# Patient Record
Sex: Female | Born: 1980 | Race: White | Hispanic: No | Marital: Married | Smoking: Never smoker
Health system: Southern US, Community
[De-identification: ages and names within clinical notes are randomized; demographics above are authoritative.]

## PROBLEM LIST (undated history)

## (undated) ENCOUNTER — Inpatient Hospital Stay (HOSPITAL_COMMUNITY): Payer: Self-pay

## (undated) DIAGNOSIS — IMO0002 Reserved for concepts with insufficient information to code with codable children: Secondary | ICD-10-CM

## (undated) DIAGNOSIS — O09299 Supervision of pregnancy with other poor reproductive or obstetric history, unspecified trimester: Secondary | ICD-10-CM

## (undated) DIAGNOSIS — Z8632 Personal history of gestational diabetes: Secondary | ICD-10-CM

## (undated) HISTORY — DX: Reserved for concepts with insufficient information to code with codable children: IMO0002

## (undated) HISTORY — PX: NO PAST SURGERIES: SHX2092

## (undated) HISTORY — DX: Supervision of pregnancy with other poor reproductive or obstetric history, unspecified trimester: O09.299

## (undated) HISTORY — DX: Personal history of gestational diabetes: Z86.32

---

## 2007-07-13 ENCOUNTER — Encounter: Admission: RE | Admit: 2007-07-13 | Discharge: 2007-07-14 | Payer: Self-pay | Admitting: Obstetrics and Gynecology

## 2007-09-11 ENCOUNTER — Inpatient Hospital Stay (HOSPITAL_COMMUNITY): Admission: AD | Admit: 2007-09-11 | Discharge: 2007-09-14 | Payer: Self-pay | Admitting: Obstetrics and Gynecology

## 2010-12-10 NOTE — H&P (Signed)
NAMETALANI, BRAZEE NO.:  [id number]   MEDICAL RECORD NO.:  [id number]          PATIENT TYPE:  INP   LOCATION:                                FACILITY:  [facility]   PHYSICIAN:  Lenoard Aden, M.D.     DATE OF BIRTH:   DATE OF ADMISSION:  09/11/2007  DATE OF DISCHARGE:  09/14/2007                              HISTORY & PHYSICAL   CHIEF COMPLAINT:  Gestational diabetes at 40 weeks. __________.   __________   It was complicated by GBS ______pos and___ gestational diabetes which  was diet controlled.   FAMILY HISTORY:  Noncontributory.   PHYSICAL EXAMINATION:  Well-developed, well-nourished _wf________  female, no acute distress.  HEENT:  ____wnl______.  Lgs: CTA  CV: RRR  ABDOMEN:  Soft, gravid, nontender.  _____ve_____ .  Cervix is closed, 50%, vertex, -1 station.  _______ext : neg c/c/e___.   ASSESSMENT:  1. Term intrauterine pregnancy at 40 weeks.  2. Gestational diabetes, diet controlled.  3. Group B strep  positive.   PLAN:  Cervidil placed, NST reactive.  Will proceed with induction.  Epidural as needed.      Lenoard Aden, M.D.  Electronically Signed     RJT/MEDQ  D:  09/11/2007  T:  09/12/2007  Job:  [id number]

## 2011-02-24 LAB — ANTIBODY SCREEN: Antibody Screen: NEGATIVE

## 2011-02-24 LAB — RUBELLA ANTIBODY, IGM: Rubella: IMMUNE

## 2011-02-24 LAB — HEPATITIS B SURFACE ANTIGEN: Hepatitis B Surface Ag: NEGATIVE

## 2011-03-10 ENCOUNTER — Other Ambulatory Visit: Payer: Self-pay | Admitting: Obstetrics and Gynecology

## 2011-03-10 DIAGNOSIS — R772 Abnormality of alphafetoprotein: Secondary | ICD-10-CM

## 2011-03-18 ENCOUNTER — Ambulatory Visit (HOSPITAL_COMMUNITY)
Admission: RE | Admit: 2011-03-18 | Discharge: 2011-03-18 | Disposition: A | Discharge status: Home / Self Care | Payer: 59 | Source: Ambulatory Visit | Attending: Obstetrics and Gynecology | Admitting: Obstetrics and Gynecology

## 2011-03-18 ENCOUNTER — Other Ambulatory Visit: Payer: Self-pay | Admitting: Obstetrics and Gynecology

## 2011-03-18 DIAGNOSIS — Z0489 Encounter for examination and observation for other specified reasons: Secondary | ICD-10-CM

## 2011-03-18 DIAGNOSIS — Z3689 Encounter for other specified antenatal screening: Secondary | ICD-10-CM | POA: Insufficient documentation

## 2011-03-18 DIAGNOSIS — O3510X0 Maternal care for (suspected) chromosomal abnormality in fetus, unspecified, not applicable or unspecified: Secondary | ICD-10-CM | POA: Insufficient documentation

## 2011-03-18 DIAGNOSIS — O351XX0 Maternal care for (suspected) chromosomal abnormality in fetus, not applicable or unspecified: Secondary | ICD-10-CM | POA: Insufficient documentation

## 2011-03-18 DIAGNOSIS — IMO0002 Reserved for concepts with insufficient information to code with codable children: Secondary | ICD-10-CM

## 2011-03-18 DIAGNOSIS — R772 Abnormality of alphafetoprotein: Secondary | ICD-10-CM

## 2011-03-18 NOTE — Progress Notes (Signed)
Genetic Counseling  High-Risk Gestation Note  Appointment Date:  03/18/2011 Referred By: Geryl Rankins, MD Date of Birth:  08-02-1980 Partner: Roma Kayser   I met with Mrs. Arnika Larzelere and her husband, Mr. Rosaland Shiffman for prenatal genetic counseling given a screen positive Down syndrome result from Quad screening through [facility].  We reviewed the results of her Quad screen which increased the risk for Down syndrome from her age-related risk to 1 in 73. They understand that screening tests are used to modify a patient's a priori risk for aneuploidy, typically based on age.  This estimate provides a pregnancy specific risk assessment and is not diagnostic.  Additionally, this screen was screen negative for Trisomy 18 and open neural tube defects. We reviewed chromosomes and nondisjunction.  We specifically discussed Down syndrome (trisomy 21), including the common features and prognosis.    We reviewed available options of targeted ultrasound and amniocentesis.  We discussed the risks, limitations, and benefits of each.  It was discussed that not all birth defects can be identified with these procedures.  A risk of 1 in 200-300 was given for amniocentesis, the primary complication being spontaneous pregnancy loss. They were counseled that 50-80% of fetuses with Down syndrome, when well visualized, have detectable anomalies or soft markers by ultrasound.  After reviewing these options, Mrs. Danner elected to proceed with targeted ultrasound only and declined amniocentesis at this time. The couple stated that wanted more time to further consider amniocentesis and will contact our office to schedule, should they desire to pursue amniocentesis in the pregnancy.  They understand that ultrasound and Quad screen cannot rule out all birth defects or genetic syndromes.    Both family histories were reviewed and found to be noncontributory for birth defects, mental retardation, recurrent pregnancy  loss, and known genetic conditions.  Without further information regarding the provided family history, an accurate genetic risk cannot be calculated. Further genetic counseling is warranted if more information is obtained.  The patient was provided written information which discussed cystic fibrosis (CF) including: the features of CF, the incidence of 1 in 3300 in the Caucasian population, autosomal recessive inheritance, and the 25% chance of having a baby with CF if both parents are carriers of CF.  We also discussed the option of carrier testing including the pros and cons of carrier testing, as well as the option of prenatal testing if needed. We also discussed that CF is included on the newborn screening panel in [place]. Mrs. Fratto reported that she thought CF carrier screening was previously performed through her OB office and was negative. We do not currently have medical records to confirm this report.   The patient denied exposure to environmental toxins or chemical agents.  She denied the use of alcohol, tobacco or street drugs.  She denied significant viral illnesses during the course of her pregnancy.  Her medical and surgical history were noncontributory.    A complete obstetrical ultrasound was performed at the time of today's evaluation.  The ultrasound report is reported separately.     I counseled the patient for approximately 25 minutes regarding the above risks and available options.     Clydie Braun Dorman Calderwood, MS, [facility] 03/18/2011

## 2011-03-18 NOTE — Progress Notes (Signed)
Patient seen for ultrasound appointment today.  Please see [facility] report for details.  

## 2011-03-20 ENCOUNTER — Other Ambulatory Visit: Payer: Self-pay | Admitting: Obstetrics and Gynecology

## 2011-03-20 DIAGNOSIS — Z0489 Encounter for examination and observation for other specified reasons: Secondary | ICD-10-CM

## 2011-03-20 DIAGNOSIS — IMO0002 Reserved for concepts with insufficient information to code with codable children: Secondary | ICD-10-CM

## 2011-03-24 ENCOUNTER — Ambulatory Visit (HOSPITAL_COMMUNITY)
Admission: RE | Admit: 2011-03-24 | Discharge: 2011-03-24 | Disposition: A | Discharge status: Home / Self Care | Payer: 59 | Source: Ambulatory Visit | Attending: Obstetrics and Gynecology | Admitting: Obstetrics and Gynecology

## 2011-03-24 ENCOUNTER — Encounter (HOSPITAL_COMMUNITY): Payer: Self-pay

## 2011-03-24 DIAGNOSIS — IMO0002 Reserved for concepts with insufficient information to code with codable children: Secondary | ICD-10-CM

## 2011-03-24 DIAGNOSIS — O351XX0 Maternal care for (suspected) chromosomal abnormality in fetus, not applicable or unspecified: Secondary | ICD-10-CM | POA: Insufficient documentation

## 2011-03-24 DIAGNOSIS — O3510X0 Maternal care for (suspected) chromosomal abnormality in fetus, unspecified, not applicable or unspecified: Secondary | ICD-10-CM | POA: Insufficient documentation

## 2011-03-24 DIAGNOSIS — Z0489 Encounter for examination and observation for other specified reasons: Secondary | ICD-10-CM

## 2011-03-27 ENCOUNTER — Telehealth (HOSPITAL_COMMUNITY): Payer: Self-pay | Admitting: MS"

## 2011-03-27 NOTE — Telephone Encounter (Signed)
FISH for aneuploidy (13, 18, 21, and sex chromosomes) results to patient from amniocentesis. FISH results normal consistent with normal female. Final karyotype pending. Will contact patient when complete chromosome analysis resulted.

## 2011-04-02 ENCOUNTER — Telehealth (HOSPITAL_COMMUNITY): Payer: Self-pay | Admitting: MS"

## 2011-04-02 NOTE — Telephone Encounter (Signed)
Final karyotype apparently normal female (46,XX) from amniocentesis. Result to patient. Gender disclosed.

## 2011-04-15 ENCOUNTER — Ambulatory Visit (HOSPITAL_COMMUNITY)
Admission: RE | Admit: 2011-04-15 | Discharge: 2011-04-15 | Disposition: A | Discharge status: Home / Self Care | Payer: 59 | Source: Ambulatory Visit | Attending: Obstetrics and Gynecology | Admitting: Obstetrics and Gynecology

## 2011-04-15 DIAGNOSIS — Z3689 Encounter for other specified antenatal screening: Secondary | ICD-10-CM | POA: Insufficient documentation

## 2011-04-15 DIAGNOSIS — O351XX0 Maternal care for (suspected) chromosomal abnormality in fetus, not applicable or unspecified: Secondary | ICD-10-CM | POA: Insufficient documentation

## 2011-04-15 DIAGNOSIS — O99810 Abnormal glucose complicating pregnancy: Secondary | ICD-10-CM | POA: Insufficient documentation

## 2011-04-15 DIAGNOSIS — IMO0002 Reserved for concepts with insufficient information to code with codable children: Secondary | ICD-10-CM

## 2011-04-15 DIAGNOSIS — O3510X0 Maternal care for (suspected) chromosomal abnormality in fetus, unspecified, not applicable or unspecified: Secondary | ICD-10-CM | POA: Insufficient documentation

## 2011-04-15 DIAGNOSIS — Z0489 Encounter for examination and observation for other specified reasons: Secondary | ICD-10-CM

## 2011-04-15 NOTE — Progress Notes (Signed)
Patient seen for ultrasound appointment today.  Please see [facility] report for details.  

## 2011-04-18 LAB — CBC
HCT: 25.5 — ABNORMAL LOW
HCT: 36.2
Hemoglobin: 12.3
MCHC: 34.1
MCHC: 35.3
MCV: 88.6
MCV: 91.6
Platelets: 178
RBC: 3.95
RDW: 12.8

## 2011-06-09 LAB — HIV ANTIBODY (ROUTINE TESTING W REFLEX): HIV: NONREACTIVE

## 2011-07-25 LAB — STREP B DNA PROBE: GBS: POSITIVE

## 2011-07-29 NOTE — L&D Delivery Note (Signed)
Delivery Note At 2:48 PM a viable female was delivered via Vaginal, Spontaneous Delivery (Presentation: Left Occiput Anterior).  APGAR: 7, 9; weight 7 lb 15.5 oz (3615 g).   Placenta status: Intact, Spontaneous.  Cord: 3 vessels with the following complications: None.  Cord pH: pending Tight nuchal cord, surgically reduced at perineum. Baby with normal features.  Anesthesia: Epidural  Episiotomy: None Lacerations: 1st degree;Labial Suture Repair: 3.0 chromic Est. Blood Loss (mL): 300  Mom to postpartum.  Baby to skin to skin .  Geryl Rankins 08/20/2011, 4:28 PM

## 2011-08-11 ENCOUNTER — Telehealth (HOSPITAL_COMMUNITY): Payer: Self-pay | Admitting: *Deleted

## 2011-08-11 ENCOUNTER — Encounter (HOSPITAL_COMMUNITY): Payer: Self-pay | Admitting: *Deleted

## 2011-08-11 NOTE — Telephone Encounter (Signed)
Preadmission screen  

## 2011-08-16 ENCOUNTER — Inpatient Hospital Stay (HOSPITAL_COMMUNITY)
Admission: AD | Admit: 2011-08-16 | Discharge: 2011-08-16 | Disposition: A | Discharge status: Home / Self Care | Payer: 59 | Source: Ambulatory Visit | Attending: Obstetrics and Gynecology | Admitting: Obstetrics and Gynecology

## 2011-08-16 ENCOUNTER — Encounter (HOSPITAL_COMMUNITY): Payer: Self-pay | Admitting: *Deleted

## 2011-08-16 DIAGNOSIS — O99891 Other specified diseases and conditions complicating pregnancy: Secondary | ICD-10-CM | POA: Insufficient documentation

## 2011-08-16 NOTE — Discharge Planning (Signed)
Dr Richardson Dopp notified of negative fern, sve, fhr, uc pattern. Patient to be discharged home and to follow up as scheduled with Dr Dion Body.

## 2011-08-16 NOTE — Progress Notes (Signed)
Pt states, " At 9:30 pm tonight I was standing up and felt water running out. I was maybe a cup. It has not happened again. Before that happened a had a pain in my upper abdomen."

## 2011-08-17 NOTE — Discharge Summary (Signed)
Pt to follow up as scheduled

## 2011-08-19 ENCOUNTER — Other Ambulatory Visit: Payer: Self-pay | Admitting: Obstetrics and Gynecology

## 2011-08-20 ENCOUNTER — Inpatient Hospital Stay (HOSPITAL_COMMUNITY)
Admission: RE | Admit: 2011-08-20 | Discharge: 2011-08-21 | DRG: 775 | Disposition: A | Discharge status: Home / Self Care | Payer: 59 | Source: Ambulatory Visit | Attending: Obstetrics and Gynecology | Admitting: Obstetrics and Gynecology

## 2011-08-20 ENCOUNTER — Encounter (HOSPITAL_COMMUNITY): Payer: Self-pay | Admitting: Anesthesiology

## 2011-08-20 ENCOUNTER — Encounter (HOSPITAL_COMMUNITY): Payer: Self-pay

## 2011-08-20 ENCOUNTER — Inpatient Hospital Stay (HOSPITAL_COMMUNITY): Payer: 59 | Admitting: Anesthesiology

## 2011-08-20 DIAGNOSIS — Z22330 Carrier of Group B streptococcus: Secondary | ICD-10-CM

## 2011-08-20 DIAGNOSIS — O99814 Abnormal glucose complicating childbirth: Principal | ICD-10-CM | POA: Diagnosis present

## 2011-08-20 DIAGNOSIS — O99892 Other specified diseases and conditions complicating childbirth: Secondary | ICD-10-CM | POA: Diagnosis present

## 2011-08-20 LAB — CBC
HCT: 36.1 % (ref 36.0–46.0)
Hemoglobin: 12.2 g/dL (ref 12.0–15.0)
MCH: 30.7 pg (ref 26.0–34.0)
MCV: 90.9 fL (ref 78.0–100.0)
Platelets: 189 10*3/uL (ref 150–400)
RBC: 3.97 MIL/uL (ref 3.87–5.11)
WBC: 6.5 10*3/uL (ref 4.0–10.5)

## 2011-08-20 LAB — GLUCOSE, CAPILLARY: Glucose-Capillary: 74 mg/dL (ref 70–99)

## 2011-08-20 MED ORDER — TERBUTALINE SULFATE 1 MG/ML IJ SOLN: 0.2500 mg | Freq: Once | INTRAMUSCULAR | Status: DC | PRN

## 2011-08-20 MED ORDER — OXYTOCIN 10 UNIT/ML IJ SOLN
INTRAMUSCULAR | Status: AC
  Filled 2011-08-20: qty 2

## 2011-08-20 MED ORDER — SENNOSIDES-DOCUSATE SODIUM 8.6-50 MG PO TABS
2.0000 | ORAL_TABLET | Freq: Every day | ORAL | Status: DC
  Administered 2011-08-20: 2 via ORAL

## 2011-08-20 MED ORDER — DIBUCAINE 1 % RE OINT
1.0000 "application " | TOPICAL_OINTMENT | RECTAL | Status: DC | PRN
  Filled 2011-08-20: qty 28

## 2011-08-20 MED ORDER — TETANUS-DIPHTH-ACELL PERTUSSIS 5-2.5-18.5 LF-MCG/0.5 IM SUSP: 0.5000 mL | Freq: Once | INTRAMUSCULAR | Status: DC

## 2011-08-20 MED ORDER — ZOLPIDEM TARTRATE 5 MG PO TABS: 5.0000 mg | ORAL_TABLET | Freq: Every evening | ORAL | Status: DC | PRN

## 2011-08-20 MED ORDER — MEASLES, MUMPS & RUBELLA VAC ~~LOC~~ INJ: 0.5000 mL | INJECTION | Freq: Once | SUBCUTANEOUS | Status: DC

## 2011-08-20 MED ORDER — OXYCODONE-ACETAMINOPHEN 5-325 MG PO TABS
1.0000 | ORAL_TABLET | ORAL | Status: DC | PRN
  Administered 2011-08-20: 2 via ORAL
  Administered 2011-08-21: 1 via ORAL
  Filled 2011-08-20: qty 1
  Filled 2011-08-20: qty 2

## 2011-08-20 MED ORDER — PHENYLEPHRINE 40 MCG/ML (10ML) SYRINGE FOR IV PUSH (FOR BLOOD PRESSURE SUPPORT)
80.0000 ug | PREFILLED_SYRINGE | INTRAVENOUS | Status: DC | PRN
  Filled 2011-08-20: qty 5

## 2011-08-20 MED ORDER — WITCH HAZEL-GLYCERIN EX PADS
1.0000 | MEDICATED_PAD | CUTANEOUS | Status: DC | PRN
Start: 2011-08-20 — End: 2011-08-21

## 2011-08-20 MED ORDER — CITRIC ACID-SODIUM CITRATE 334-500 MG/5ML PO SOLN: 30.0000 mL | ORAL | Status: DC | PRN

## 2011-08-20 MED ORDER — ONDANSETRON HCL 4 MG/2ML IJ SOLN: 4.0000 mg | Freq: Four times a day (QID) | INTRAMUSCULAR | Status: DC | PRN

## 2011-08-20 MED ORDER — FENTANYL 2.5 MCG/ML BUPIVACAINE 1/10 % EPIDURAL INFUSION (WH - ANES)
INTRAMUSCULAR | Status: DC | PRN
  Administered 2011-08-20: 14 mL/h via EPIDURAL

## 2011-08-20 MED ORDER — EPHEDRINE 5 MG/ML INJ: 10.0000 mg | INTRAVENOUS | Status: DC | PRN

## 2011-08-20 MED ORDER — SIMETHICONE 80 MG PO CHEW: 80.0000 mg | CHEWABLE_TABLET | ORAL | Status: DC | PRN

## 2011-08-20 MED ORDER — PHENYLEPHRINE 40 MCG/ML (10ML) SYRINGE FOR IV PUSH (FOR BLOOD PRESSURE SUPPORT): 80.0000 ug | PREFILLED_SYRINGE | INTRAVENOUS | Status: DC | PRN

## 2011-08-20 MED ORDER — IBUPROFEN 600 MG PO TABS
600.0000 mg | ORAL_TABLET | Freq: Four times a day (QID) | ORAL | Status: DC
  Administered 2011-08-20 – 2011-08-21 (×5): 600 mg via ORAL
  Filled 2011-08-20 (×5): qty 1

## 2011-08-20 MED ORDER — ONDANSETRON HCL 4 MG PO TABS: 4.0000 mg | ORAL_TABLET | ORAL | Status: DC | PRN

## 2011-08-20 MED ORDER — IBUPROFEN 600 MG PO TABS: 600.0000 mg | ORAL_TABLET | Freq: Four times a day (QID) | ORAL | Status: DC | PRN

## 2011-08-20 MED ORDER — LANOLIN HYDROUS EX OINT: TOPICAL_OINTMENT | CUTANEOUS | Status: DC | PRN

## 2011-08-20 MED ORDER — FENTANYL 2.5 MCG/ML BUPIVACAINE 1/10 % EPIDURAL INFUSION (WH - ANES)
14.0000 mL/h | INTRAMUSCULAR | Status: DC
  Administered 2011-08-20: 14 mL/h via EPIDURAL
  Filled 2011-08-20 (×2): qty 60

## 2011-08-20 MED ORDER — OXYTOCIN 20 UNITS IN LACTATED RINGERS INFUSION - SIMPLE: 125.0000 mL/h | Freq: Once | INTRAVENOUS | Status: DC

## 2011-08-20 MED ORDER — EPHEDRINE 5 MG/ML INJ
10.0000 mg | INTRAVENOUS | Status: DC | PRN
  Filled 2011-08-20: qty 4

## 2011-08-20 MED ORDER — DIPHENHYDRAMINE HCL 50 MG/ML IJ SOLN: 12.5000 mg | INTRAMUSCULAR | Status: DC | PRN

## 2011-08-20 MED ORDER — LACTATED RINGERS IV SOLN
INTRAVENOUS | Status: DC
  Administered 2011-08-20: 14:00:00 via INTRAVENOUS
  Administered 2011-08-20: 125 mL/h via INTRAVENOUS
  Administered 2011-08-20: 14:00:00 via INTRAVENOUS

## 2011-08-20 MED ORDER — OXYTOCIN BOLUS FROM INFUSION
500.0000 mL | Freq: Once | INTRAVENOUS | Status: DC
  Filled 2011-08-20: qty 500

## 2011-08-20 MED ORDER — LIDOCAINE HCL (PF) 1 % IJ SOLN
30.0000 mL | INTRAMUSCULAR | Status: DC | PRN
  Filled 2011-08-20: qty 30

## 2011-08-20 MED ORDER — DEXTROSE 5 % IV SOLN
5.0000 10*6.[IU] | Freq: Once | INTRAVENOUS | Status: AC
  Administered 2011-08-20: 5 10*6.[IU] via INTRAVENOUS
  Filled 2011-08-20: qty 5

## 2011-08-20 MED ORDER — LACTATED RINGERS IV SOLN: 500.0000 mL | Freq: Once | INTRAVENOUS | Status: DC

## 2011-08-20 MED ORDER — PENICILLIN G POTASSIUM 5000000 UNITS IJ SOLR
2.5000 10*6.[IU] | INTRAVENOUS | Status: DC
  Administered 2011-08-20: 2.5 10*6.[IU] via INTRAVENOUS
  Filled 2011-08-20 (×5): qty 2.5

## 2011-08-20 MED ORDER — MAGNESIUM HYDROXIDE 400 MG/5ML PO SUSP: 30.0000 mL | ORAL | Status: DC | PRN

## 2011-08-20 MED ORDER — METHYLERGONOVINE MALEATE 0.2 MG PO TABS: 0.2000 mg | ORAL_TABLET | ORAL | Status: DC | PRN

## 2011-08-20 MED ORDER — PRENATAL MULTIVITAMIN CH
1.0000 | ORAL_TABLET | Freq: Every day | ORAL | Status: DC
  Administered 2011-08-21: 1 via ORAL
  Filled 2011-08-20: qty 1

## 2011-08-20 MED ORDER — METHYLERGONOVINE MALEATE 0.2 MG/ML IJ SOLN: 0.2000 mg | INTRAMUSCULAR | Status: DC | PRN

## 2011-08-20 MED ORDER — BENZOCAINE-MENTHOL 20-0.5 % EX AERO
INHALATION_SPRAY | CUTANEOUS | Status: AC
  Filled 2011-08-20: qty 56

## 2011-08-20 MED ORDER — ONDANSETRON HCL 4 MG/2ML IJ SOLN: 4.0000 mg | INTRAMUSCULAR | Status: DC | PRN

## 2011-08-20 MED ORDER — ACETAMINOPHEN 325 MG PO TABS: 650.0000 mg | ORAL_TABLET | ORAL | Status: DC | PRN

## 2011-08-20 MED ORDER — BENZOCAINE-MENTHOL 20-0.5 % EX AERO: 1.0000 "application " | INHALATION_SPRAY | CUTANEOUS | Status: DC | PRN

## 2011-08-20 MED ORDER — DIPHENHYDRAMINE HCL 25 MG PO CAPS: 25.0000 mg | ORAL_CAPSULE | Freq: Four times a day (QID) | ORAL | Status: DC | PRN

## 2011-08-20 MED ORDER — LACTATED RINGERS IV SOLN: 500.0000 mL | INTRAVENOUS | Status: DC | PRN

## 2011-08-20 MED ORDER — OXYTOCIN 20 UNITS IN LACTATED RINGERS INFUSION - SIMPLE
1.0000 m[IU]/min | INTRAVENOUS | Status: DC
  Administered 2011-08-20: 333 m[IU]/min via INTRAVENOUS
  Administered 2011-08-20: 2 m[IU]/min via INTRAVENOUS
  Filled 2011-08-20: qty 1000

## 2011-08-20 MED ORDER — SODIUM BICARBONATE 8.4 % IV SOLN
INTRAVENOUS | Status: DC | PRN
  Administered 2011-08-20: 4 mL via EPIDURAL

## 2011-08-20 MED ORDER — OXYCODONE-ACETAMINOPHEN 5-325 MG PO TABS: 2.0000 | ORAL_TABLET | ORAL | Status: DC | PRN

## 2011-08-20 MED ORDER — OXYTOCIN 20 UNITS IN LACTATED RINGERS INFUSION - SIMPLE: 125.0000 mL/h | INTRAVENOUS | Status: DC | PRN

## 2011-08-20 MED ORDER — FLEET ENEMA 7-19 GM/118ML RE ENEM: 1.0000 | ENEMA | RECTAL | Status: DC | PRN

## 2011-08-20 NOTE — H&P (Signed)
Morgan Wheeler is a 31 y.o. female G3 P1011 at 70 4/7 weeks presenting for induction of labor due to Gestational Diabetes.  Pt thought her water broke over the weekend but she was ruled out.  Pt ate tortilla prior to coming to hospital.  Maternal Medical History:  Fetal activity: Perceived fetal activity is normal.    Prenatal Complications - Diabetes: gestational. Diabetes is managed by diet.      OB History    Grav Para Term Preterm Abortions TAB SAB Ect Mult Living   3 1 1  0 1 0 1 0 0 1     Past Medical History  Diagnosis Date  . History of gestational diabetes in prior pregnancy, currently pregnant   . Abnormal Pap smear    Past Surgical History  Procedure Date  . No past surgeries    Family History: family history includes Hypertension in her father and Peripheral vascular disease in her mother. Social History:  reports that she has never smoked. She has never used smokeless tobacco. She reports that she does not drink alcohol or use illicit drugs.  Review of Systems  Constitutional: Negative for fever and chills.  Genitourinary:       No vaginal bleeding, irregular contractions      Maternal Exam:  Uterine Assessment: Contraction strength is mild.  Contraction frequency is irregular.   Abdomen: Estimated fetal weight is 7 1/2 pounds.   Fetal presentation: vertex  Introitus: Normal vulva. Ferning test: not done.   Pelvis: adequate for delivery.   Cervix: Previously evaluated by RN 1-2 cm/60 %/-2, somewhat posterior  Fetal Exam Fetal Monitor Review: Variability: moderate (6-25 bpm).   Pattern: accelerations present.   Reactive no decels , irregular contractions, q 4-7 min  Fetal State Assessment: Category I - tracings are normal.     Physical Exam  Constitutional: She is oriented to person, place, and time. She appears well-developed and well-nourished.  HENT:  Head: Normocephalic and atraumatic.  Eyes: EOM are normal.  Neck: Normal range of motion.   GI: There is no tenderness.       Gravid, no fundal tenderness  Genitourinary: Uterus normal.  Neurological: She is alert and oriented to person, place, and time.  Skin: Skin is warm and dry.  Psychiatric: She has a normal mood and affect.    Prenatal labs: ABO, Rh: A/Positive/-- (07/30 0000) Antibody: Negative (07/30 0000) Rubella: Immune (07/30 0000) RPR: Nonreactive (07/30 0000)  HBsAg: Negative (07/30 0000)  HIV: Non-reactive (11/12 0000)  GBS: Positive (12/28 0000)   Assessment/Plan: Induction of labor with Pitocin. GBS+ S/p Loading dose of PCN. AROM after 2nd dose of PCN.  Epidural per pt request.  Geryl Rankins 08/20/2011, 1:41 PM

## 2011-08-20 NOTE — Anesthesia Procedure Notes (Signed)

## 2011-08-20 NOTE — Progress Notes (Signed)
Morgan Wheeler is a 31 y.o. G3P1011 at [redacted]w[redacted]d   Subjective:  Pt comfortable s/p epidural.  Objective: BP 99/67  Pulse 76  Temp(Src) 97.8 F (36.6 C) (Oral)  Resp 20  Ht 5\' 3"  (1.6 m)  Wt 73.029 kg (161 lb)  BMI 28.52 kg/m2  SpO2 100%  LMP 11/15/2010      FHT:  Reactive, no decels UC:   regular, every 2-3 minutes Pitocin 12 mU SVE:   Dilation: 4.5 Effacement (%): 60 Station: -1 Exam by:: Dr. Dion Body AROM clear fluid  Labs: Lab Results  Component Value Date   WBC 6.5 08/20/2011   HGB 12.2 08/20/2011   HCT 36.1 08/20/2011   MCV 90.9 08/20/2011   PLT 189 08/20/2011    Assessment / Plan: Induction of labor due to gestational diabetes,  progressing well on pitocin  Labor: Progressing normally Preeclampsia:  no signs or symptoms of toxicity Fetal Wellbeing:  Category I Pain Control:  Epidural I/D:  s/p 2 doses of PCN Anticipated MOD:  NSVD  Morgan Wheeler 08/20/2011, 1:56 PM

## 2011-08-20 NOTE — Anesthesia Preprocedure Evaluation (Addendum)
Anesthesia Evaluation  Patient identified by MRN, date of birth, ID band Patient awake    Reviewed: Allergy & Precautions, H&P , Patient's Chart, lab work & pertinent test results  Airway Mallampati: II TM Distance: >3 FB Neck ROM: full    Dental  (+) Teeth Intact   Pulmonary  clear to auscultation        Cardiovascular regular Normal    Neuro/Psych    GI/Hepatic   Endo/Other  Diabetes mellitus- (no meds), Gestational  Renal/GU      Musculoskeletal   Abdominal   Peds  Hematology   Anesthesia Other Findings       Reproductive/Obstetrics (+) Pregnancy                          Anesthesia Physical Anesthesia Plan  ASA: III  Anesthesia Plan: Epidural   Post-op Pain Management:    Induction:   Airway Management Planned:   Additional Equipment:   Intra-op Plan:   Post-operative Plan:   Informed Consent: I have reviewed the patients History and Physical, chart, labs and discussed the procedure including the risks, benefits and alternatives for the proposed anesthesia with the patient or authorized representative who has indicated his/her understanding and acceptance.   Dental Advisory Given  Plan Discussed with:   Anesthesia Plan Comments: (Labs checked- platelets confirmed with RN in room. Fetal heart tracing, per RN, reported to be stable enough for sitting procedure. Discussed epidural, and patient consents to the procedure:  included risk of possible headache,backache, failed block, allergic reaction, and nerve injury. This patient was asked if she had any questions or concerns before the procedure started. )       Anesthesia Quick Evaluation

## 2011-08-21 LAB — CBC
HCT: 34.5 % — ABNORMAL LOW (ref 36.0–46.0)
MCHC: 33.3 g/dL (ref 30.0–36.0)
MCV: 92.5 fL (ref 78.0–100.0)
RDW: 13.8 % (ref 11.5–15.5)

## 2011-08-21 MED ORDER — IBUPROFEN 600 MG PO TABS: 600.0000 mg | ORAL_TABLET | Freq: Four times a day (QID) | ORAL | Status: AC | PRN

## 2011-08-21 NOTE — Progress Notes (Signed)
Post Partum Day 1 Subjective: no complaints, up ad lib, voiding and tolerating PO She request early discharge home   Objective: Blood pressure 106/73, pulse 69, temperature 97.6 F (36.4 C), temperature source Oral, resp. rate 18, height 5\' 3"  (1.6 m), weight 73.029 kg (161 lb), last menstrual period 11/15/2010, SpO2 100.00%, unknown if currently breastfeeding.  Physical Exam:  General: alert and cooperative Lochia: appropriate Uterine Fundus: firm Incision: NA DVT Evaluation: No evidence of DVT seen on physical exam.   Basename 08/21/11 0550 08/20/11 0654  HGB 11.5* 12.2  HCT 34.5* 36.1    Assessment/Plan: Discharge home and Breastfeeding   LOS: 1 day   Mikaiah Stoffer J. 08/21/2011, 8:49 AM

## 2011-08-21 NOTE — Discharge Summary (Signed)
Obstetric Discharge Summary Reason for Admission: induction of labor Prenatal Procedures: none Intrapartum Procedures: spontaneous vaginal delivery Postpartum Procedures: none Complications-Operative and Postpartum: none Hemoglobin  Date Value Range Status  08/21/2011 11.5* 12.0-15.0 (g/dL) Final     HCT  Date Value Range Status  08/21/2011 34.5* 36.0-46.0 (%) Final    Discharge Diagnoses: Term Pregnancy-delivered  Discharge Information: Date: 08/21/2011 Activity: pelvic rest Diet: routine Medications: PNV and Ibuprofen Condition: stable Instructions: refer to practice specific booklet Discharge to: home Follow-up Information    Follow up with Geryl Rankins, MD. Schedule an appointment as soon as possible for a visit in 6 weeks. (ppv)    Contact information:   [address] [place], [place]. [address] [place] [address] [phone]          Newborn Data: Live born female  Birth Weight: 7 lb 15.5 oz (3615 g) APGAR: 7, 9  Home with mother.  Morgan Wheeler J. 08/21/2011, 8:52 AM

## 2011-08-21 NOTE — Transfer of Care (Signed)
Anesthesia Post Note  Patient: Morgan Wheeler  Procedure(s) Performed: * No procedures listed *  Anesthesia type: Epidural  Patient location: Mother/Baby  Post pain: Pain level controlled  Post assessment: Post-op Vital signs reviewed  Last Vitals:  Filed Vitals:   08/21/11 1400  BP: 104/65  Pulse: 83  Temp: 36.6 C  Resp: 18    Post vital signs: Reviewed  Level of consciousness:alert  Complications: No apparent anesthesia complications

## 2011-08-25 NOTE — Addendum Note (Signed)
Addendum  created 08/25/11 1329 by Jahziel Sinn L. Rodman Pickle, MD   Modules edited:Notes Section

## 2011-08-25 NOTE — Anesthesia Postprocedure Evaluation (Signed)
Patient stable following vaginal delivery.  

## 2012-01-04 IMAGING — US US AMNIOCENTESIS
1 series · 11 of 11 positions shown · non-contrast
Comparison: none

[Series 1: inc dsr · 0.30mm/px · 11 of 11 slices shown]
[im 1/11]
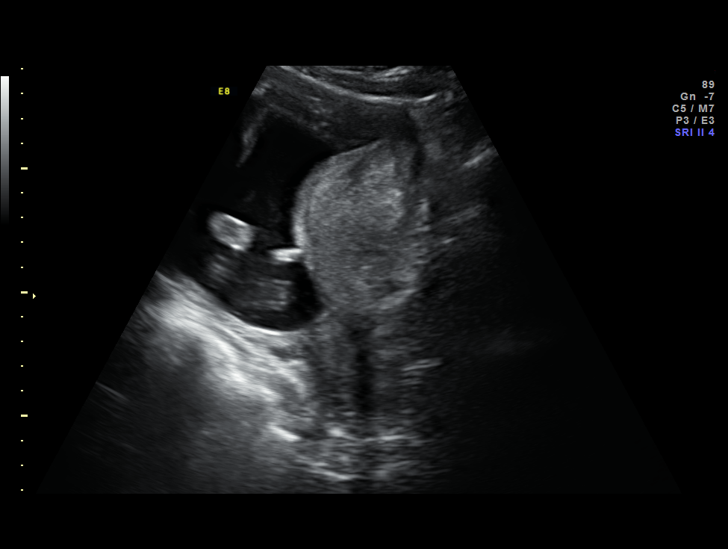
[im 2/11]
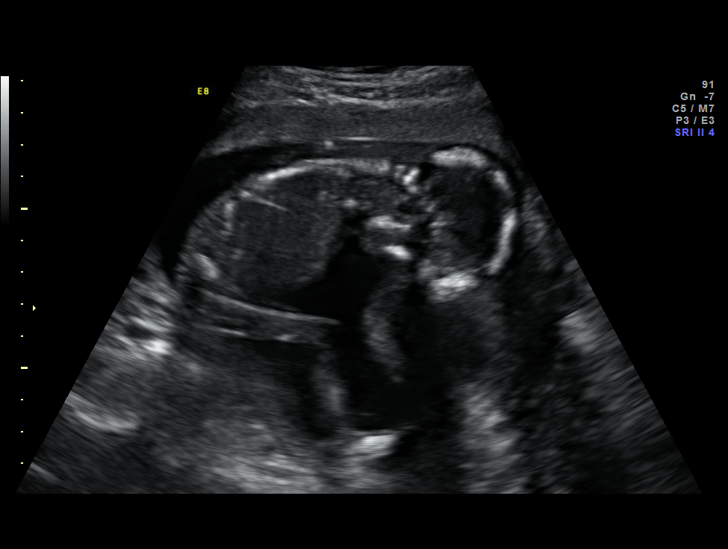
[im 3/11]
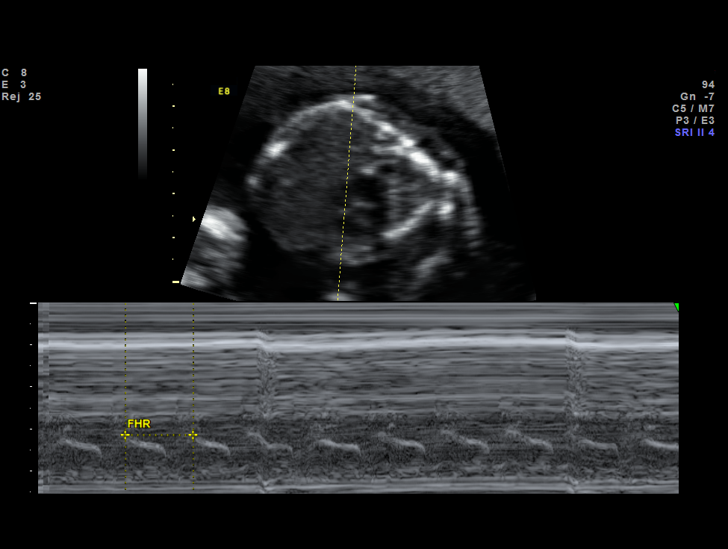
[im 4/11]
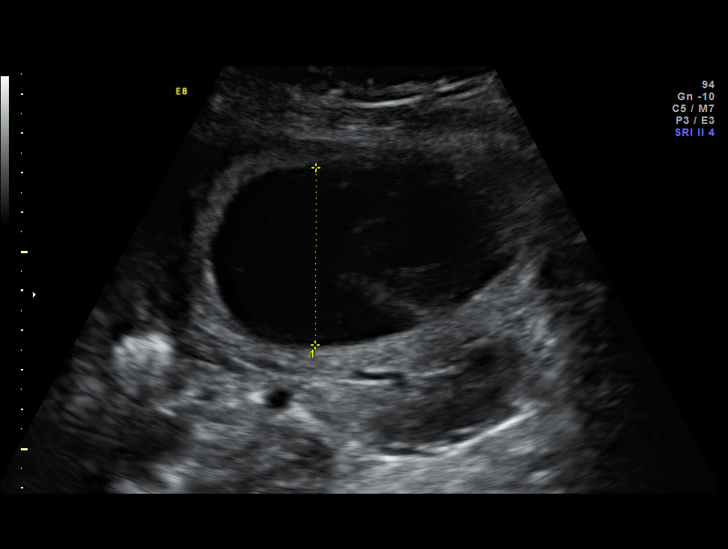
[im 5/11]
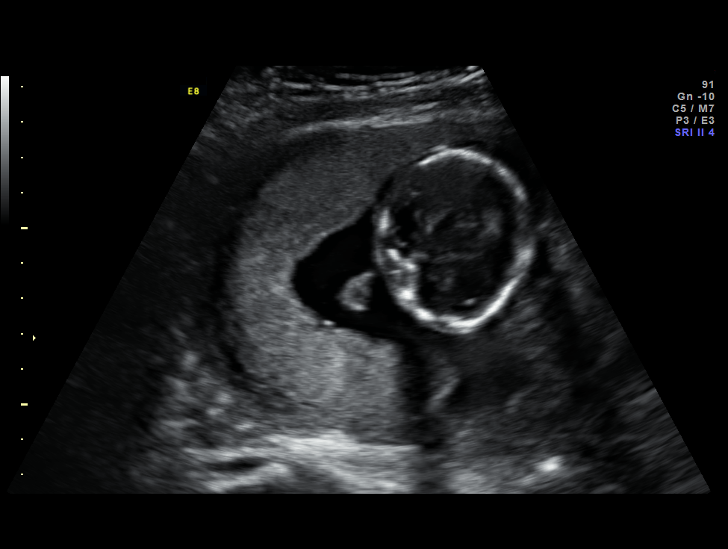
[im 6/11]
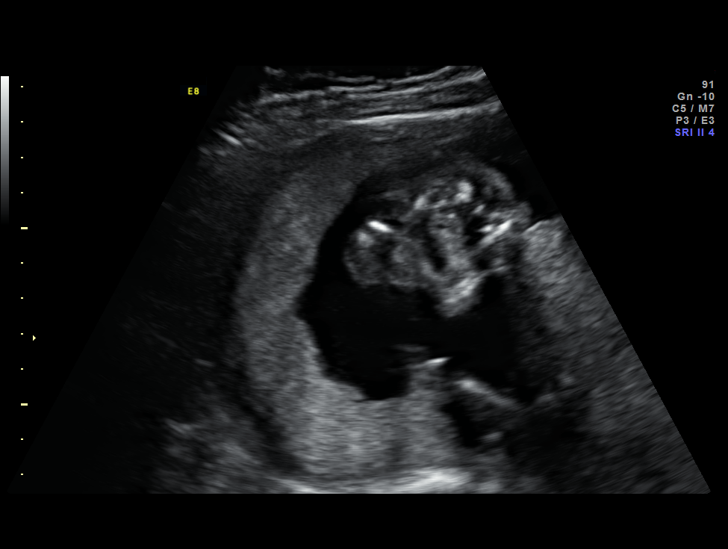
[im 7/11]
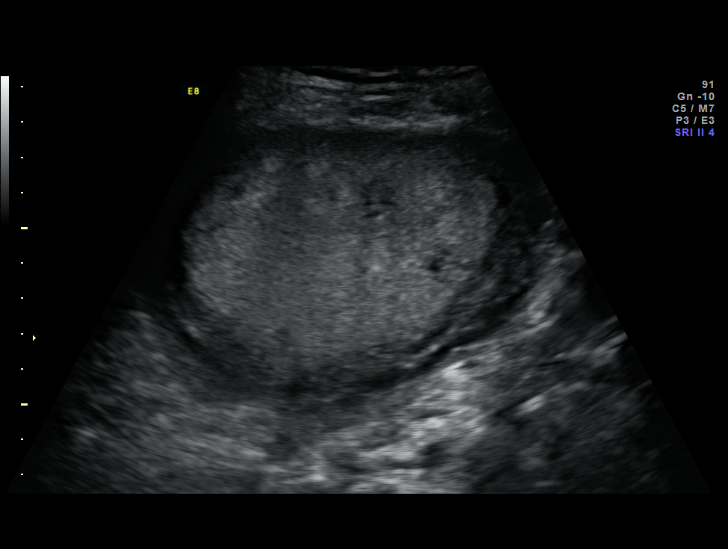
[im 8/11]
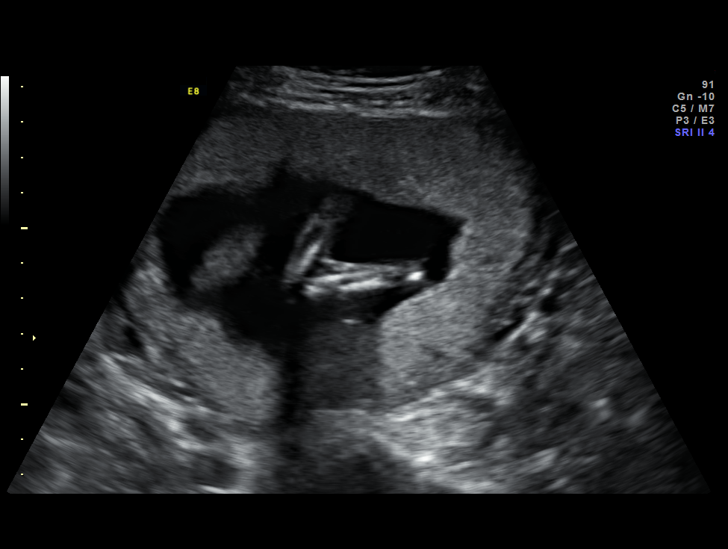
[im 9/11]
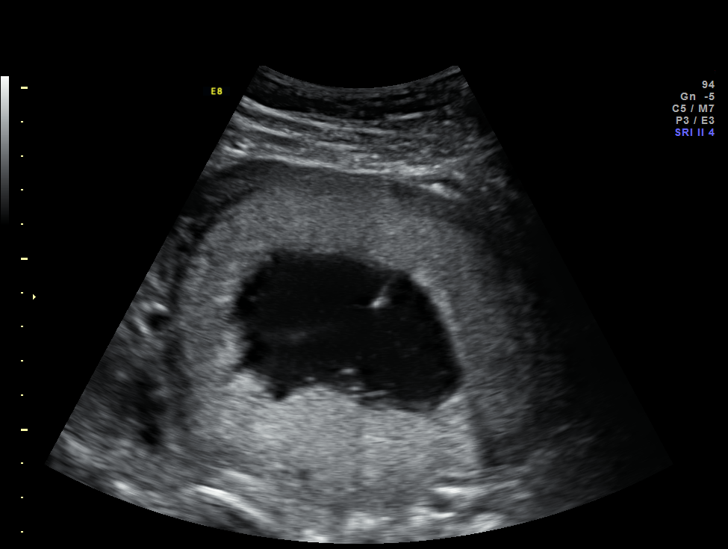
[im 10/11]
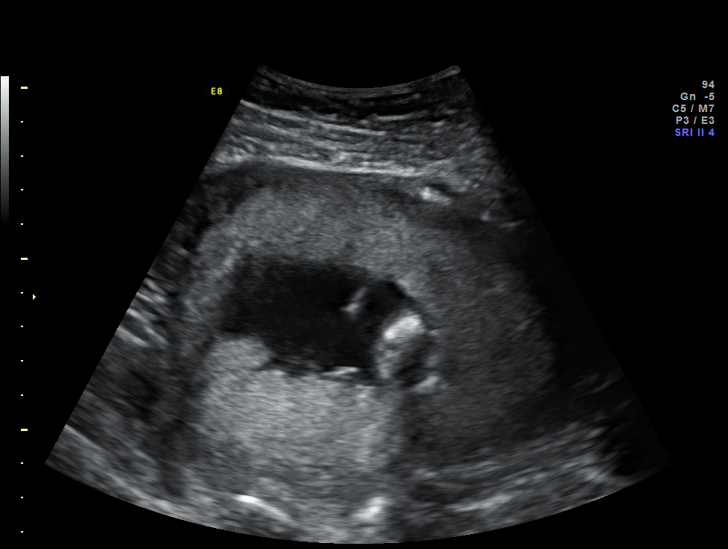
[im 11/11]
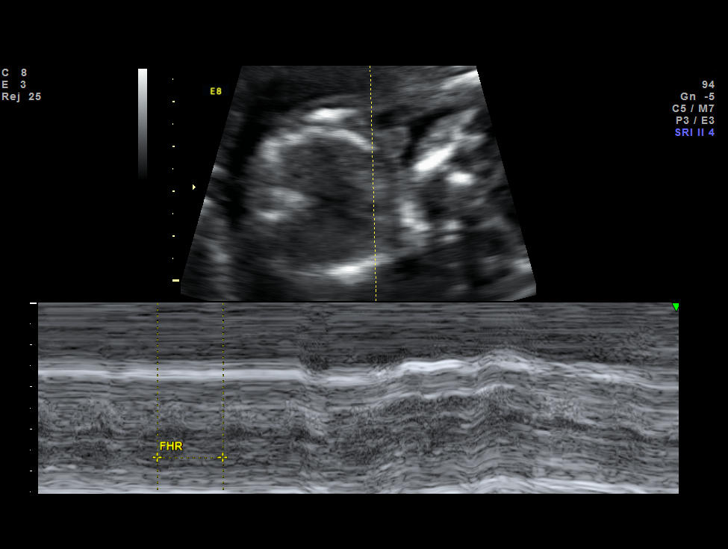

[11 of 11 positions shown; findings below may reference images not displayed]

Canned report from images found in remote index.

Refer to host system for actual result text.

## 2012-01-05 ENCOUNTER — Other Ambulatory Visit (HOSPITAL_COMMUNITY)
Admission: RE | Admit: 2012-01-05 | Discharge: 2012-01-05 | Disposition: A | Discharge status: Home / Self Care | Payer: 59 | Source: Ambulatory Visit | Attending: Obstetrics and Gynecology | Admitting: Obstetrics and Gynecology

## 2012-01-05 ENCOUNTER — Other Ambulatory Visit: Payer: Self-pay | Admitting: Obstetrics and Gynecology

## 2012-01-05 DIAGNOSIS — Z01419 Encounter for gynecological examination (general) (routine) without abnormal findings: Secondary | ICD-10-CM | POA: Insufficient documentation

## 2012-01-05 DIAGNOSIS — Z1159 Encounter for screening for other viral diseases: Secondary | ICD-10-CM | POA: Insufficient documentation

## 2012-01-26 IMAGING — US US OB FOLLOW-UP
1 series · 14 of 28 positions shown · non-contrast
Comparison: none

[Series 1: us ob follow-up · 0.22mm/px · 14 of 62 slices shown]
[im 3/62]
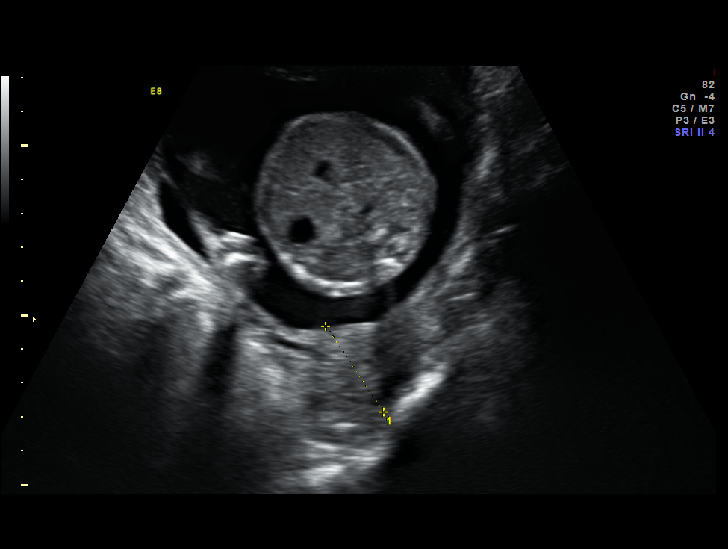
[im 7/62]
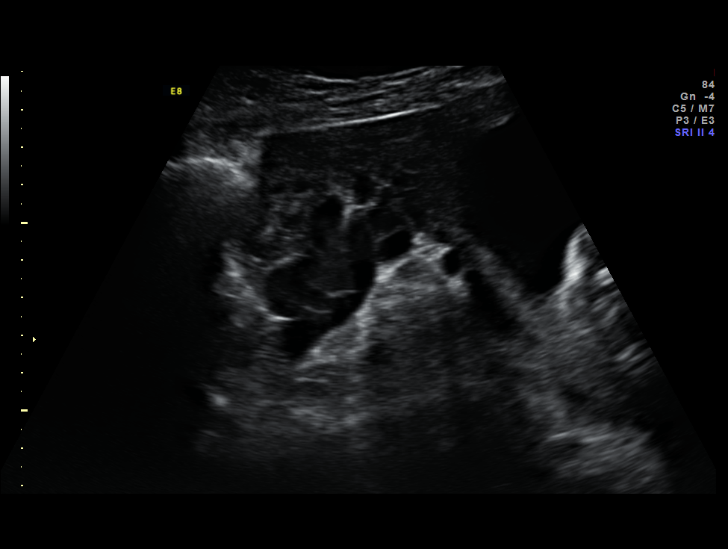
[im 12/62]
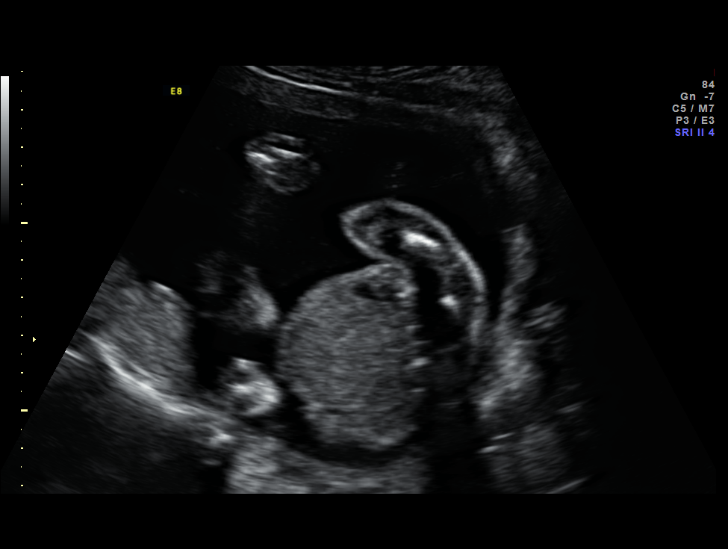
[im 16/62]
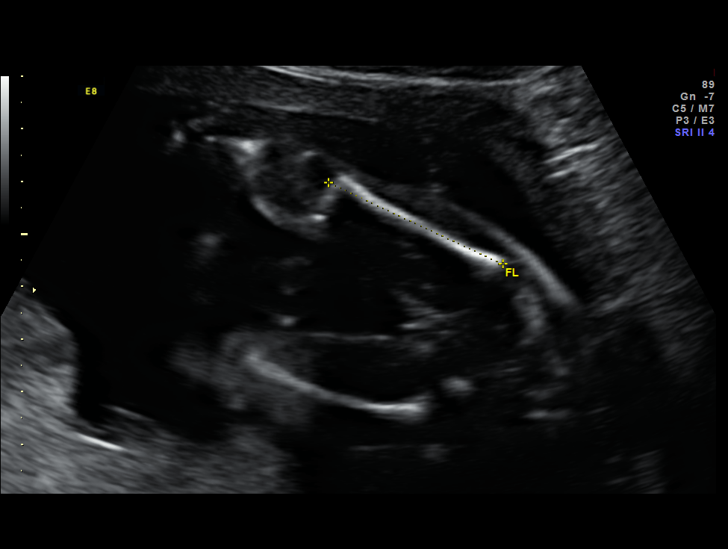
[im 21/62]
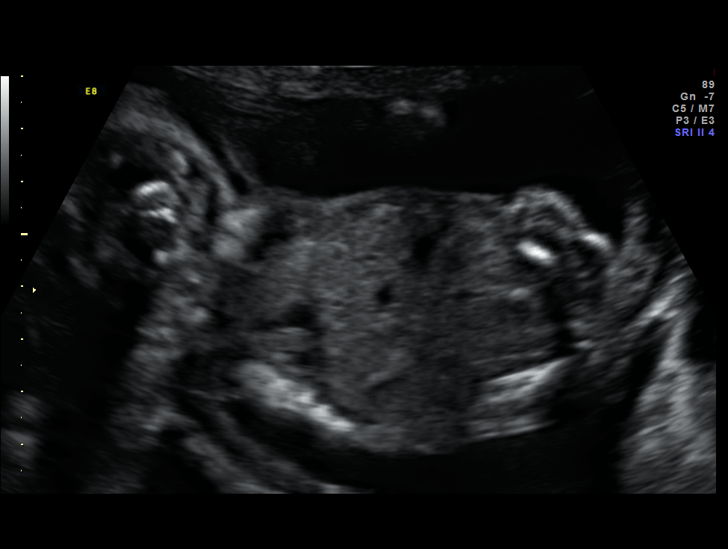
[im 25/62]
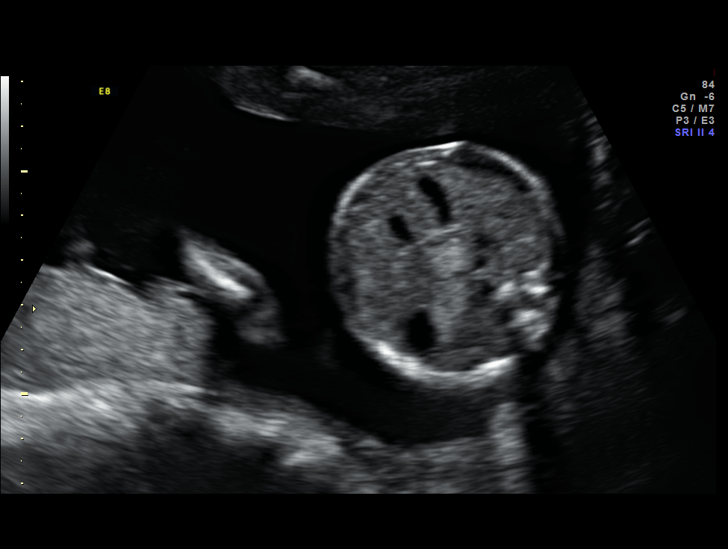
[im 30/62]
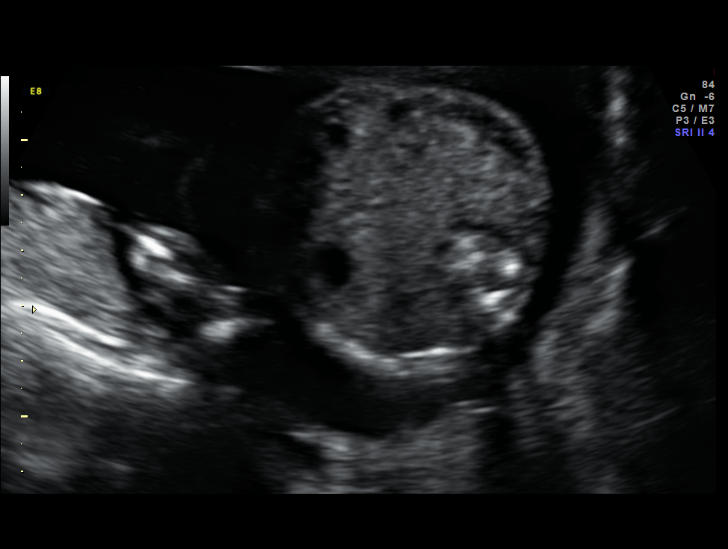
[im 34/62]
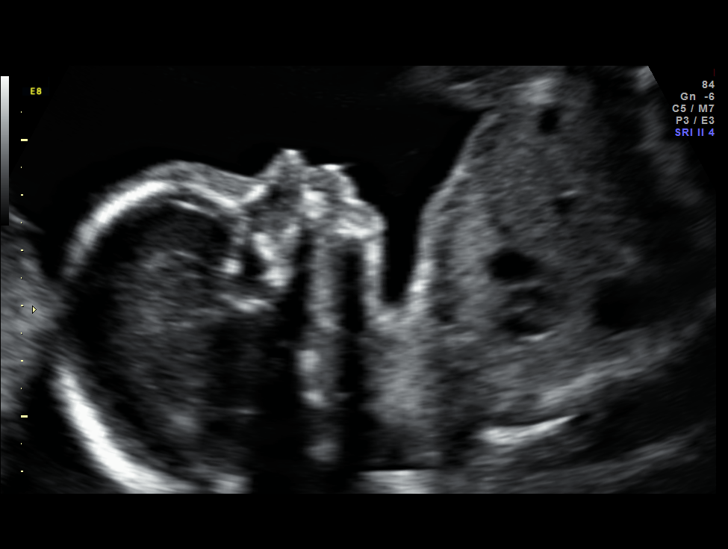
[im 39/62]
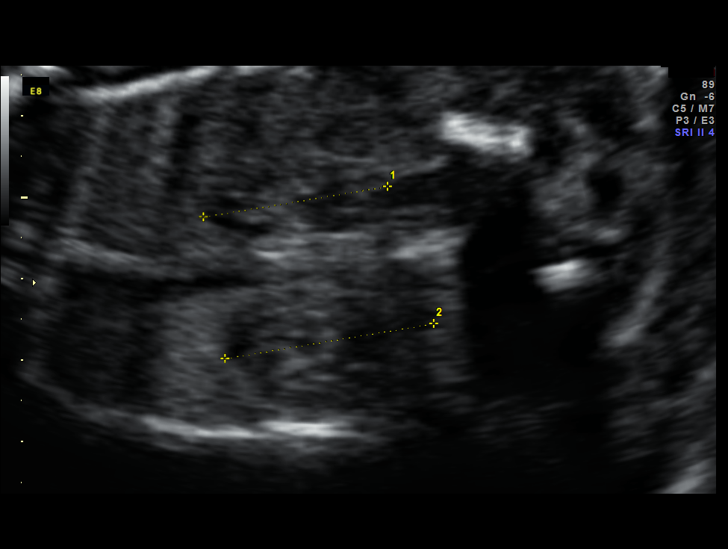
[im 43/62]
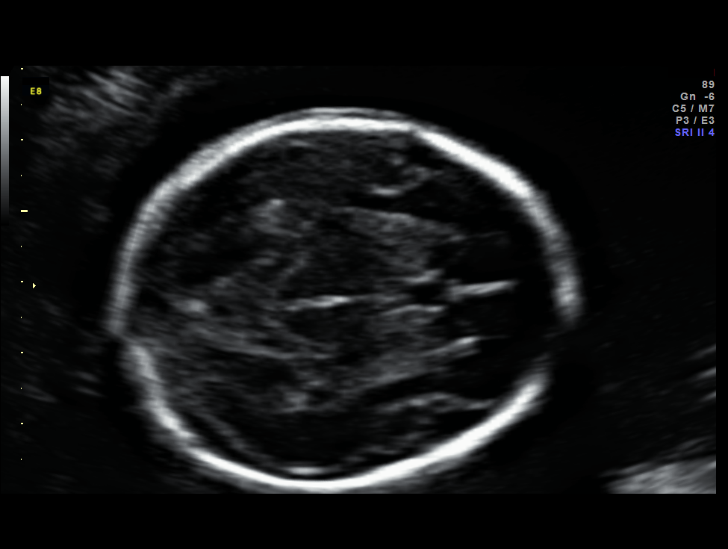
[im 48/62]
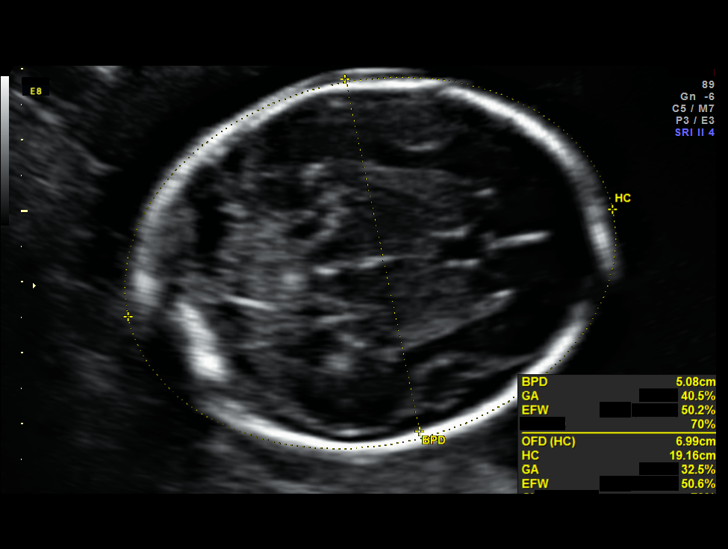
[im 52/62]
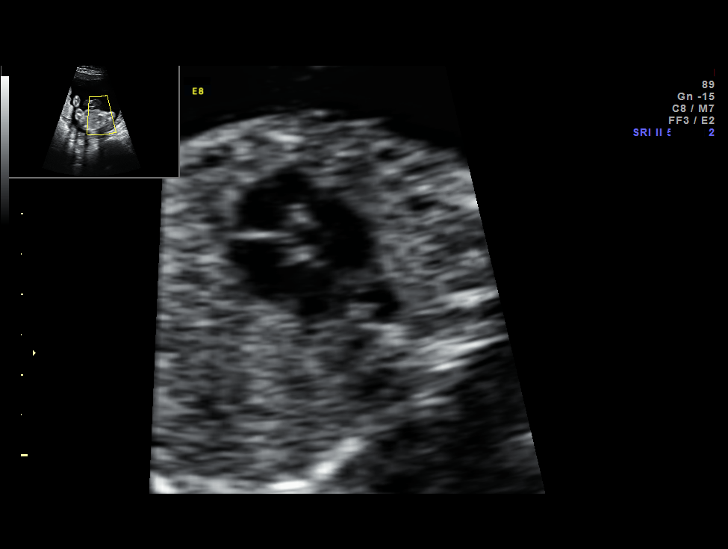
[im 57/62]
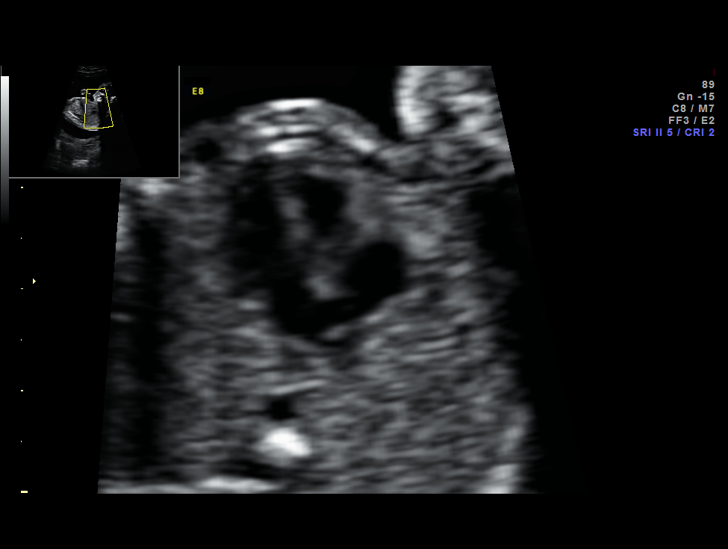
[im 62/62]
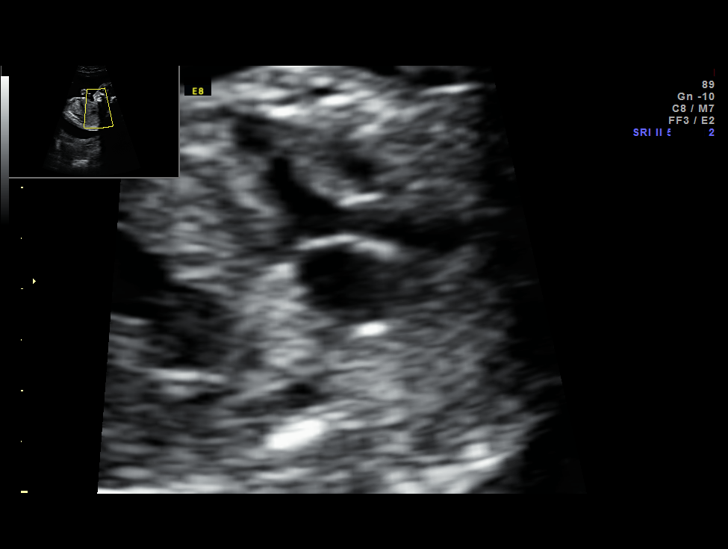

[14 of 28 positions shown; findings below may reference images not displayed]

Canned report from images found in remote index.

Refer to host system for actual result text.

## 2014-05-29 ENCOUNTER — Encounter (HOSPITAL_COMMUNITY): Payer: Self-pay

## 2016-01-10 ENCOUNTER — Ambulatory Visit (INDEPENDENT_AMBULATORY_CARE_PROVIDER_SITE_OTHER): Payer: BLUE CROSS/BLUE SHIELD | Admitting: Osteopathic Medicine

## 2016-01-10 ENCOUNTER — Encounter: Payer: Self-pay | Admitting: Osteopathic Medicine

## 2016-01-10 VITALS — BP 100/71 | HR 82 | Temp 98.4°F | Resp 16 | Ht 65.0 in | Wt 160.0 lb

## 2016-01-10 DIAGNOSIS — I8393 Asymptomatic varicose veins of bilateral lower extremities: Secondary | ICD-10-CM

## 2016-01-10 DIAGNOSIS — I8390 Asymptomatic varicose veins of unspecified lower extremity: Secondary | ICD-10-CM | POA: Insufficient documentation

## 2016-01-10 DIAGNOSIS — N926 Irregular menstruation, unspecified: Secondary | ICD-10-CM | POA: Insufficient documentation

## 2016-01-10 DIAGNOSIS — Z0000 Encounter for general adult medical examination without abnormal findings: Secondary | ICD-10-CM | POA: Diagnosis not present

## 2016-01-10 DIAGNOSIS — Z8639 Personal history of other endocrine, nutritional and metabolic disease: Secondary | ICD-10-CM | POA: Insufficient documentation

## 2016-01-10 DIAGNOSIS — Z87891 Personal history of nicotine dependence: Secondary | ICD-10-CM

## 2016-01-10 DIAGNOSIS — Z8632 Personal history of gestational diabetes: Secondary | ICD-10-CM | POA: Insufficient documentation

## 2016-01-10 DIAGNOSIS — Z713 Dietary counseling and surveillance: Secondary | ICD-10-CM

## 2016-01-10 DIAGNOSIS — Z23 Encounter for immunization: Secondary | ICD-10-CM | POA: Insufficient documentation

## 2016-01-10 LAB — CBC
HCT: 42.3 % (ref 35.0–45.0)
Hemoglobin: 14.3 g/dL (ref 11.7–15.5)
MCH: 30.6 pg (ref 27.0–33.0)
MCHC: 33.8 g/dL (ref 32.0–36.0)
MCV: 90.6 fL (ref 80.0–100.0)
MPV: 9.2 fL (ref 7.5–12.5)
PLATELETS: 271 10*3/uL (ref 140–400)
RBC: 4.67 MIL/uL (ref 3.80–5.10)
RDW: 13.6 % (ref 11.0–15.0)
WBC: 6.3 10*3/uL (ref 3.8–10.8)

## 2016-01-10 NOTE — Progress Notes (Signed)
HPI: Morgan Wheeler is a 35 y.o. female who [facility] for chief complaint of:  Chief Complaint  Patient presents with  . Establish Care    PREVENTIVE CARE REVIEWED AS BELOW   VARICOSE VEIN - bothersome and occasionally sore but nonulcerated, nonpainful most of the time  HX GESTATIONAL DIABETES - never had to be on medications, was diet controlled.   CHOLESTEROL - history of borderline cholesterol levels    Past medical, social and family history reviewed: Past Medical History  Diagnosis Date  . History of gestational diabetes in prior pregnancy, currently pregnant   . Abnormal Pap smear    Past Surgical History  Procedure Laterality Date  . No past surgeries     Social History  Substance Use Topics  . Smoking status: Never Smoker   . Smokeless tobacco: Never Used  . Alcohol Use: No   Family History  Problem Relation Age of Onset  . Peripheral vascular disease Mother   . Hypertension Father     No current outpatient prescriptions on file.   No current facility-administered medications for this visit.   No Known Allergies    Review of Systems: CONSTITUTIONAL:  No  fever, no chills, No recent illness, No unintentional weight changes HEAD/EYES/EARS/NOSE/THROAT: No  headache, no vision change, no hearing change, No sore throat, No  sinus pressure CARDIAC: No  chest pain, No  pressure, No palpitations, No  orthopnea RESPIRATORY: No  cough, No  shortness of breath/wheeze GASTROINTESTINAL: No  nausea, No  vomiting, No  abdominal pain, No  blood in stool, No  diarrhea, No  constipation  MUSCULOSKELETAL: No  myalgia/arthralgia GENITOURINARY: No  incontinence, No  abnormal genital bleeding/discharge SKIN: No  rash/wounds/concerning lesions HEM/ONC: No  easy bruising/bleeding, No  abnormal lymph node ENDOCRINE: No polyuria/polydipsia/polyphagia, No  heat/cold intolerance  NEUROLOGIC: No  weakness, No  dizziness, No   slurred speech PSYCHIATRIC: No  concerns with depression, No  concerns with anxiety, No sleep problems  Exam:  BP 100/71 mmHg  Pulse 82  Temp(Src) 98.4 F (36.9 C) (Oral)  Resp 16  Ht 5\' 5"  (1.651 m)  Wt 160 lb (72.576 kg)  BMI 26.63 kg/m2  SpO2 98%  LMP 11/27/2015 (Approximate) Constitutional: VS see above. General Appearance: alert, well-developed, well-nourished, NAD Eyes: Normal lids and conjunctive, non-icteric sclera, PERRLA Ears, Nose, Mouth, Throat: MMM, Normal external inspection ears/nares/mouth/lips/gums, TM normal bilaterally. Pharynx/tonsils no erythema, no exudate. Nasal mucosa normal.  Neck: No masses, trachea midline. No thyroid enlargement. No tenderness/mass appreciated. No lymphadenopathy Respiratory: Normal respiratory effort. no wheeze, no rhonchi, no rales Cardiovascular: S1/S2 normal, no murmur, no rub/gallop auscultated. RRR. No lower extremity edema. Mild bulging varicose vein on medial R lower leg Gastrointestinal: Nontender, no masses. No hepatomegaly, no splenomegaly. No hernia appreciated. Bowel sounds normal. Rectal exam deferred.  Musculoskeletal: Gait normal. No clubbing/cyanosis of digits.  Neurological: No cranial nerve deficit on limited exam. Motor and sensation intact and symmetric Skin: warm, dry, intact. No rash/ulcer. No concerning nevi or subq nodules on limited exam.   Psychiatric: Normal judgment/insight. Normal mood and affect. Oriented x3.     ASSESSMENT/PLAN:  Annual physical exam - Plan: CBC, Hemoglobin A1c, Lipid panel, COMPLETE METABOLIC PANEL WITH GFR  History of gestational diabetes - Plan: Hemoglobin A1c  History of hyperlipidemia - Plan: Lipid panel  Irregular periods - no hormonal/barrier contraception, no OCP desired, pt declines pregnancy test today, following with OBGYN  Former smoker  Need for tetanus  booster - patient declines  Varicose vein of leg - pt will check with insurance about coverage for correction, advised  compression stockings  Weight loss counseling, encounter for - advised calorie counting, avoid late night snacks and junk food, increase cardiovascular and weight-bearing exercises      FEMALE PREVENTIVE CARE  ANNUAL SCREENING/COUNSELING Tobacco - no Alcohol - social drinker Diet/Exercise - HEALTHY HABITS DISCUSSED TO DECREASE CV RISK Depression - PQH2 Negative Domestic violence concerns - no HTN SCREENING - SEE VITALS Vaccination status - SEE BELOW  SEXUAL HEALTH Sexually active in the past year - yes With - Yes with female. STI - The patient denies history of sexually transmitted disease. STI testing today? - no   INFECTIOUS DISEASE SCREENING HIV - all adults 15-65 - does not need GC/CT - sexually active - does not need HepC - DOB [birth year] - does not need TB - does not need  DISEASE SCREENING Lipid - needs DM2 - needs Osteoporosis - does not need  CANCER SCREENING Cervical - does not need Breast - MAMMO - does not need Lung - does not need Colon - does not need  ADULT VACCINATION Influenza - was not indicated Td - was offered and declined by the patient HPV - was not indicated Zoster - was not indicated Pneumonia - was not indicated  OTHER Fall - exercise and Vit D age 86+ - does not need Consider ASA - age 46-59 - does not need   All questions were answered. Visit summary with medication list and pertinent instructions was printed for patient to review. ER/RTC precautions were reviewed with the patient. Return in about 1 year (around 01/09/2017) for ANNUAL PHYSICAL .

## 2016-01-11 LAB — LIPID PANEL
Cholesterol: 193 mg/dL (ref 125–200)
HDL: 43 mg/dL — ABNORMAL LOW (ref >=46)
LDL CALC: 131 mg/dL — AB (ref <130)
TRIGLYCERIDES: 95 mg/dL (ref <150)
Total CHOL/HDL Ratio: 4.5 Ratio (ref <=5.0)
VLDL: 19 mg/dL (ref <30)

## 2016-01-11 LAB — HEMOGLOBIN A1C
HEMOGLOBIN A1C: 5.8 % — AB (ref <5.7)
Mean Plasma Glucose: 120 mg/dL

## 2016-01-11 LAB — COMPLETE METABOLIC PANEL WITH GFR
ALT: 19 U/L (ref 6–29)
AST: 16 U/L (ref 10–30)
Albumin: 4.4 g/dL (ref 3.6–5.1)
Alkaline Phosphatase: 47 U/L (ref 33–115)
BILIRUBIN TOTAL: 0.6 mg/dL (ref 0.2–1.2)
BUN: 12 mg/dL (ref 7–25)
CHLORIDE: 104 mmol/L (ref 98–110)
CO2: 20 mmol/L (ref 20–31)
CREATININE: 0.63 mg/dL (ref 0.50–1.10)
Calcium: 9.4 mg/dL (ref 8.6–10.2)
GLUCOSE: 79 mg/dL (ref 65–99)
Potassium: 4.2 mmol/L (ref 3.5–5.3)
SODIUM: 139 mmol/L (ref 135–146)
TOTAL PROTEIN: 7.2 g/dL (ref 6.1–8.1)

## 2016-02-18 ENCOUNTER — Encounter: Payer: Self-pay | Admitting: Osteopathic Medicine

## 2016-02-18 ENCOUNTER — Ambulatory Visit (INDEPENDENT_AMBULATORY_CARE_PROVIDER_SITE_OTHER): Payer: BLUE CROSS/BLUE SHIELD | Admitting: Osteopathic Medicine

## 2016-02-18 VITALS — BP 109/74 | HR 69 | Ht 65.0 in | Wt 156.0 lb

## 2016-02-18 DIAGNOSIS — K648 Other hemorrhoids: Secondary | ICD-10-CM

## 2016-02-18 DIAGNOSIS — F411 Generalized anxiety disorder: Secondary | ICD-10-CM | POA: Diagnosis not present

## 2016-02-18 MED ORDER — FLUOXETINE HCL (PMDD) 10 MG PO TABS: 10.0000 mg | ORAL_TABLET | Freq: Every day | ORAL | 1 refills | Status: DC

## 2016-02-18 MED ORDER — HYDROCORTISONE 2.5 % RE CREA: TOPICAL_CREAM | RECTAL | 1 refills | Status: AC

## 2016-02-18 MED ORDER — FLUOXETINE HCL (PMDD) 10 MG PO TABS: 10.0000 mg | ORAL_TABLET | Freq: Every day | ORAL | 1 refills | Status: AC

## 2016-02-18 NOTE — Progress Notes (Signed)
HPI: Morgan Wheeler is a 35 y.o. Not Hispanic or Latino female  who presents to [facility] today, 02/18/16,  for chief complaint of:  Chief Complaint  Patient presents with  . Anxiety     Reports increasing anxiety since she has been staying at home with the kids, not currently working. She has applied for several jobs. She states that she is having some trouble sleeping, she is noticing significant increased irritability which is making it difficult for her to interact with her family. Has been on buspirone in the past but didn't really like the way that this made her feel.  Reviewed records from previous PCP, 11/10/2013. Labs reviewed: A1c 5.6, vitamin D a bit low at 22.4, TSH normal, cholesterol borderline, liver and kidney function no concerns, CBC are in  Past medical, surgical, social and family history reviewed: Past Medical History:  Diagnosis Date  . Abnormal Pap smear   . History of gestational diabetes in prior pregnancy, currently pregnant    Past Surgical History:  Procedure Laterality Date  . NO PAST SURGERIES     Social History  Substance Use Topics  . Smoking status: Never Smoker  . Smokeless tobacco: Never Used  . Alcohol use No   Family History  Problem Relation Age of Onset  . Peripheral vascular disease Mother   . Hypertension Father      Current medication list and allergy/intolerance information reviewed:   No current outpatient prescriptions on file.   No current facility-administered medications for this visit.    No Known Allergies    Review of Systems:  Constitutional:  No  fever, no chills, No recent illness, No unintentional weight changes. (+) significant fatigue.   HEENT: No  headache, no vision change  Cardiac: No  chest pain, (+) occasional pressure with severe anxiety/irritability, No palpitations, No  Orthopnea  Respiratory:  No  shortness of breath. No  Cough  Gastrointestinal: No  abdominal  pain, No  nausea, No  vomiting,  No  blood in stool, (+) hemorrhoids - requesting medicine for this.   Musculoskeletal: No new myalgia/arthralgia  Skin: No  Rash, No other wounds/concerning lesions  Neurologic: No  weakness, No  dizziness,   Psychiatric: No  concerns with depression, (+)concerns with anxiety, No sleep problems, (+) mood problems  Exam:  BP 109/74 (BP Location: Right Arm, Patient Position: Sitting, Cuff Size: Normal)   Pulse 69   Ht 5\' 5"  (1.651 m)   Wt 156 lb (70.8 kg)   BMI 25.96 kg/m   Constitutional: VS see above. General Appearance: alert, well-developed, well-nourished, NAD  Musculoskeletal: Gait normal. No clubbing/cyanosis of digits.   Neurological: No cranial nerve deficit on limited exam.. Cerebellar function grossly intact. Normal balance/coordination. No tremor.   Skin: warm, dry, intact. No rash/ulcer\  Psychiatric: Normal judgment/insight. Normal mood and affect. Oriented x3.      ASSESSMENT/PLAN:  Initiate low-dose fluoxetine for generalized anxiety disorder, patient is advised that she needs to take this daily to receive full effect, can increase dose in 2 weeks if tolerating medication well. Patient is significantly concerned about weight gain, advised the fluoxetine has fairly low incidence of this but can certainly trial another SSRI if Prozac has side effects/ineffective.  Generalized anxiety disorder - Plan: Fluoxetine HCl, PMDD, 10 MG TABS  Other hemorrhoids - Plan: hydrocortisone (ANUSOL-HC) 2.5 % rectal cream     Visit summary with medication list and pertinent instructions was printed for patient to review. All questions  at time of visit were answered - patient instructed to contact office with any additional concerns. ER/RTC precautions were reviewed with the patient. Follow-up plan: Return in about 6 weeks (around 03/31/2016), or SOONER IF NEEDED, for MEDICATION FOLLOW UP . If doing well at the 20 mg dose, let me know and I can call it  in, however if not receiving desired benefit or if having side effects, come\let me know ASAP.  Note: Patient is not on hormonal/surgical contraception, states she is not pregnant, advised of possible risk of SSRI with pregnancy, though low, is something she needs to be aware of. Patient verbalizes understanding.  Note: Total time spent 25 minutes, greater than 50% of the visit was spent face-to-face counseling and coordinating care for the following: The primary encounter diagnosis was Generalized anxiety disorder. A diagnosis of Other hemorrhoids was also pertinent to this visit.Marland Kitchen
